# Patient Record
Sex: Male | Born: 1950 | Race: White | Hispanic: No | Smoking: Current every day smoker
Health system: Southern US, Community
[De-identification: ages and names within clinical notes are randomized; demographics above are authoritative.]

---

## 2012-03-19 ENCOUNTER — Emergency Department: Payer: Self-pay | Admitting: Emergency Medicine

## 2012-03-19 LAB — URINALYSIS, COMPLETE
Bacteria: NONE SEEN
Bilirubin,UR: NEGATIVE
Blood: NEGATIVE
Glucose,UR: NEGATIVE mg/dL (ref 0–75)
Ketone: NEGATIVE
Nitrite: NEGATIVE
Specific Gravity: 1.023 (ref 1.003–1.030)
Squamous Epithelial: <1
WBC UR: 1 /HPF (ref 0–5)

## 2012-03-19 LAB — DRUG SCREEN, URINE
Amphetamines, Ur Screen: NEGATIVE (ref ?–1000)
Cocaine Metabolite,Ur ~~LOC~~: POSITIVE (ref ?–300)
MDMA (Ecstasy)Ur Screen: NEGATIVE (ref ?–500)
Tricyclic, Ur Screen: NEGATIVE (ref ?–1000)

## 2012-03-19 LAB — CBC
HCT: 41.4 % (ref 40.0–52.0)
HGB: 13.9 g/dL (ref 13.0–18.0)
MCH: 32.9 pg (ref 26.0–34.0)
MCHC: 33.5 g/dL (ref 32.0–36.0)
Platelet: 225 10*3/uL (ref 150–440)

## 2012-03-19 LAB — COMPREHENSIVE METABOLIC PANEL
Alkaline Phosphatase: 97 U/L (ref 50–136)
BUN: 12 mg/dL (ref 7–18)
Bilirubin,Total: 0.4 mg/dL (ref 0.2–1.0)
Creatinine: 0.97 mg/dL (ref 0.60–1.30)
EGFR (Non-African Amer.): >60
Osmolality: 281 (ref 275–301)
SGPT (ALT): 82 U/L — ABNORMAL HIGH (ref 12–78)
Sodium: 141 mmol/L (ref 136–145)
Total Protein: 8 g/dL (ref 6.4–8.2)

## 2012-03-19 LAB — ETHANOL: Ethanol %: <0.003 % (ref 0.000–0.080)

## 2012-03-19 LAB — TSH: Thyroid Stimulating Horm: 1.13 u[IU]/mL

## 2012-03-22 ENCOUNTER — Emergency Department: Payer: Self-pay | Admitting: Emergency Medicine

## 2012-03-22 LAB — CBC
HCT: 47.7 % (ref 40.0–52.0)
HGB: 16.2 g/dL (ref 13.0–18.0)
MCH: 32.5 pg (ref 26.0–34.0)
MCV: 96 fL (ref 80–100)
RBC: 4.99 10*6/uL (ref 4.40–5.90)
RDW: 14 % (ref 11.5–14.5)

## 2012-03-22 LAB — URINALYSIS, COMPLETE
Bilirubin,UR: NEGATIVE
Blood: NEGATIVE
Ketone: NEGATIVE
Ph: 7 (ref 4.5–8.0)
Specific Gravity: 1.011 (ref 1.003–1.030)
Squamous Epithelial: NONE SEEN

## 2012-03-22 LAB — COMPREHENSIVE METABOLIC PANEL
Albumin: 3.4 g/dL (ref 3.4–5.0)
Alkaline Phosphatase: 105 U/L (ref 50–136)
BUN: 10 mg/dL (ref 7–18)
Bilirubin,Total: 1.1 mg/dL — ABNORMAL HIGH (ref 0.2–1.0)
Calcium, Total: 9.7 mg/dL (ref 8.5–10.1)
Co2: 28 mmol/L (ref 21–32)
Creatinine: 0.82 mg/dL (ref 0.60–1.30)
Glucose: 93 mg/dL (ref 65–99)
Osmolality: 267 (ref 275–301)
Sodium: 134 mmol/L — ABNORMAL LOW (ref 136–145)
Total Protein: 9.1 g/dL — ABNORMAL HIGH (ref 6.4–8.2)

## 2012-03-22 LAB — MAGNESIUM: Magnesium: 2.3 mg/dL

## 2012-03-22 LAB — TROPONIN I: Troponin-I: <0.02 ng/mL

## 2012-05-21 ENCOUNTER — Emergency Department: Payer: Self-pay | Admitting: Internal Medicine

## 2012-05-21 LAB — CBC
HCT: 45.4 % (ref 40.0–52.0)
MCH: 32.2 pg (ref 26.0–34.0)
MCHC: 33.7 g/dL (ref 32.0–36.0)
Platelet: 410 10*3/uL (ref 150–440)
RDW: 14.6 % — ABNORMAL HIGH (ref 11.5–14.5)
WBC: 12.6 10*3/uL — ABNORMAL HIGH (ref 3.8–10.6)

## 2012-05-21 LAB — DRUG SCREEN, URINE
Amphetamines, Ur Screen: NEGATIVE (ref ?–1000)
Barbiturates, Ur Screen: NEGATIVE (ref ?–200)
Benzodiazepine, Ur Scrn: NEGATIVE (ref ?–200)
Cocaine Metabolite,Ur ~~LOC~~: POSITIVE (ref ?–300)
MDMA (Ecstasy)Ur Screen: NEGATIVE (ref ?–500)
Methadone, Ur Screen: POSITIVE (ref ?–300)
Tricyclic, Ur Screen: NEGATIVE (ref ?–1000)

## 2012-05-21 LAB — ETHANOL: Ethanol: <3 mg/dL

## 2012-05-21 LAB — URINALYSIS, COMPLETE
Bilirubin,UR: NEGATIVE
Glucose,UR: NEGATIVE mg/dL (ref 0–75)
Ketone: NEGATIVE
Ph: 6 (ref 4.5–8.0)
Protein: 30
RBC,UR: 8 /HPF (ref 0–5)
Specific Gravity: 1.027 (ref 1.003–1.030)
Squamous Epithelial: <1
WBC UR: 3 /HPF (ref 0–5)

## 2012-05-21 LAB — COMPREHENSIVE METABOLIC PANEL
Albumin: 3.2 g/dL — ABNORMAL LOW (ref 3.4–5.0)
Alkaline Phosphatase: 124 U/L (ref 50–136)
Calcium, Total: 9.3 mg/dL (ref 8.5–10.1)
EGFR (African American): >60
EGFR (Non-African Amer.): >60
Glucose: 101 mg/dL — ABNORMAL HIGH (ref 65–99)
Osmolality: 260 (ref 275–301)
SGOT(AST): 19 U/L (ref 15–37)

## 2012-05-21 LAB — TSH: Thyroid Stimulating Horm: <0.01 u[IU]/mL — ABNORMAL LOW

## 2012-08-20 ENCOUNTER — Emergency Department: Payer: Self-pay | Admitting: Emergency Medicine

## 2012-08-20 LAB — URINALYSIS, COMPLETE
Bacteria: NONE SEEN
Glucose,UR: NEGATIVE mg/dL (ref 0–75)
Ketone: NEGATIVE
Nitrite: NEGATIVE
Ph: 6 (ref 4.5–8.0)
Protein: NEGATIVE
Squamous Epithelial: NONE SEEN
WBC UR: <1 /HPF (ref 0–5)

## 2012-08-20 LAB — DRUG SCREEN, URINE
Barbiturates, Ur Screen: NEGATIVE (ref ?–200)
Cannabinoid 50 Ng, Ur ~~LOC~~: NEGATIVE (ref ?–50)
Cocaine Metabolite,Ur ~~LOC~~: POSITIVE (ref ?–300)
MDMA (Ecstasy)Ur Screen: NEGATIVE (ref ?–500)
Methadone, Ur Screen: NEGATIVE (ref ?–300)
Opiate, Ur Screen: POSITIVE (ref ?–300)
Tricyclic, Ur Screen: NEGATIVE (ref ?–1000)

## 2012-08-20 LAB — CBC
MCH: 32.8 pg (ref 26.0–34.0)
MCHC: 33.4 g/dL (ref 32.0–36.0)
MCV: 98 fL (ref 80–100)
RBC: 4.26 10*6/uL — ABNORMAL LOW (ref 4.40–5.90)
WBC: 9.5 10*3/uL (ref 3.8–10.6)

## 2012-08-20 LAB — COMPREHENSIVE METABOLIC PANEL
Alkaline Phosphatase: 105 U/L (ref 50–136)
Calcium, Total: 8.6 mg/dL (ref 8.5–10.1)
Co2: 25 mmol/L (ref 21–32)
EGFR (African American): >60
EGFR (Non-African Amer.): >60
Potassium: 3.7 mmol/L (ref 3.5–5.1)
SGOT(AST): 71 U/L — ABNORMAL HIGH (ref 15–37)
SGPT (ALT): 62 U/L (ref 12–78)
Sodium: 136 mmol/L (ref 136–145)

## 2012-08-20 LAB — ETHANOL: Ethanol %: <0.003 % (ref 0.000–0.080)

## 2013-10-04 IMAGING — CR DG CHEST 2V
1 series · 3 of 3 positions shown · non-contrast
Comparison: none

REASON FOR EXAM: bradycardia
COMMENTS:

PROCEDURE:     DXR - DXR CHEST PA (OR AP) AND LATERAL  - March 22, 2012  [DATE]
RESULT:     Comparison: None

[Series 1: pa · 0.17mm/px · 3 of 3 slices shown]
[im 1/3]
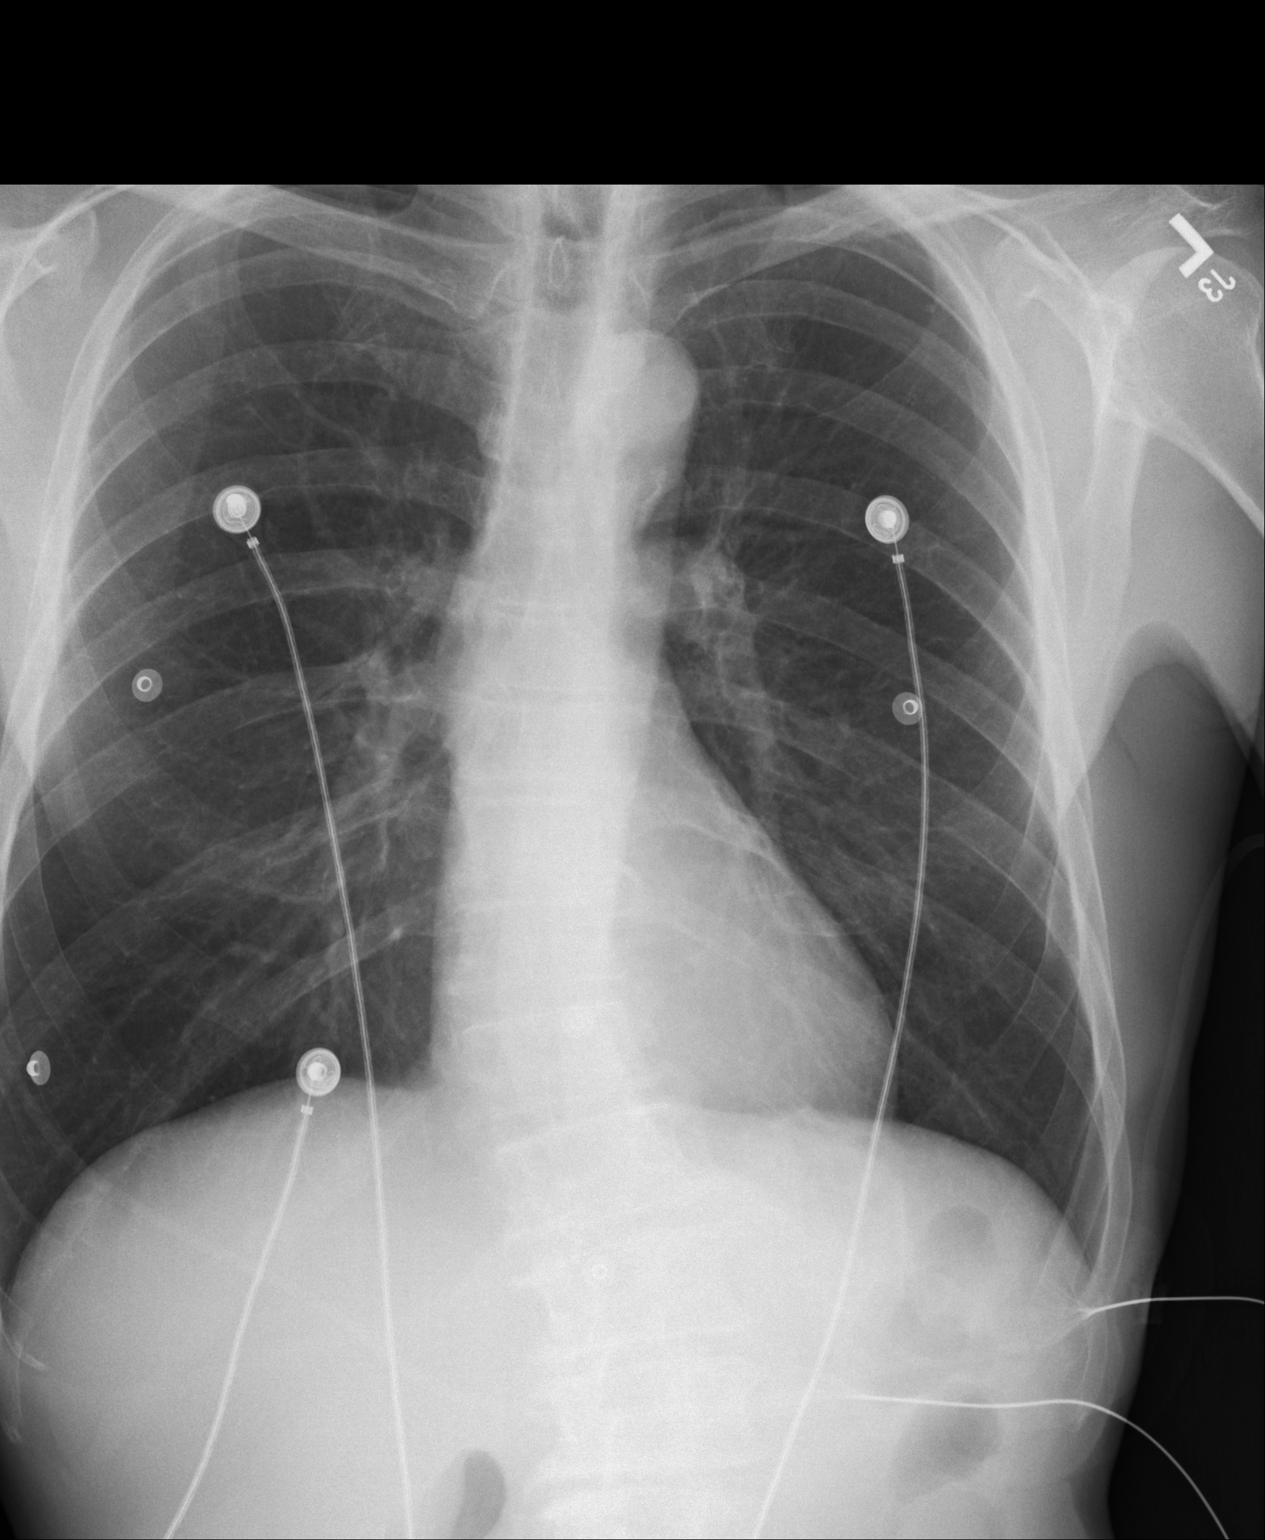
[im 2/3]
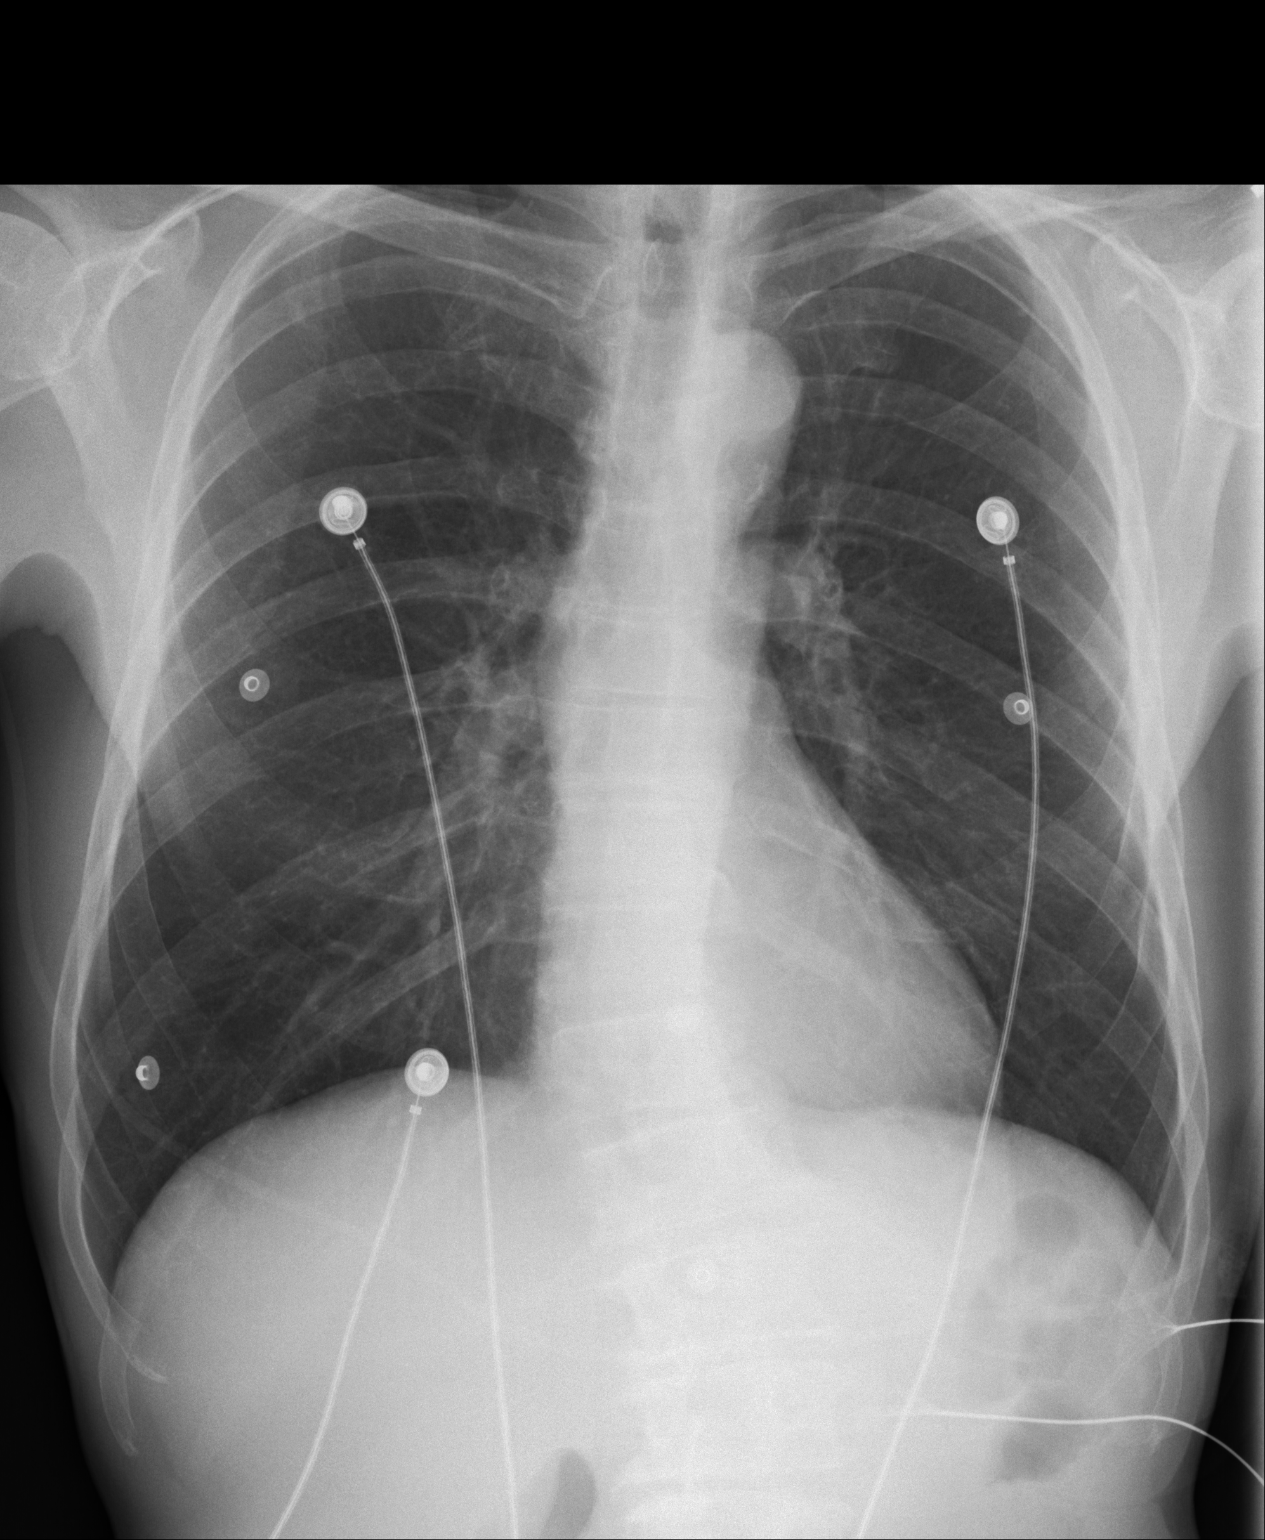
[im 3/3]
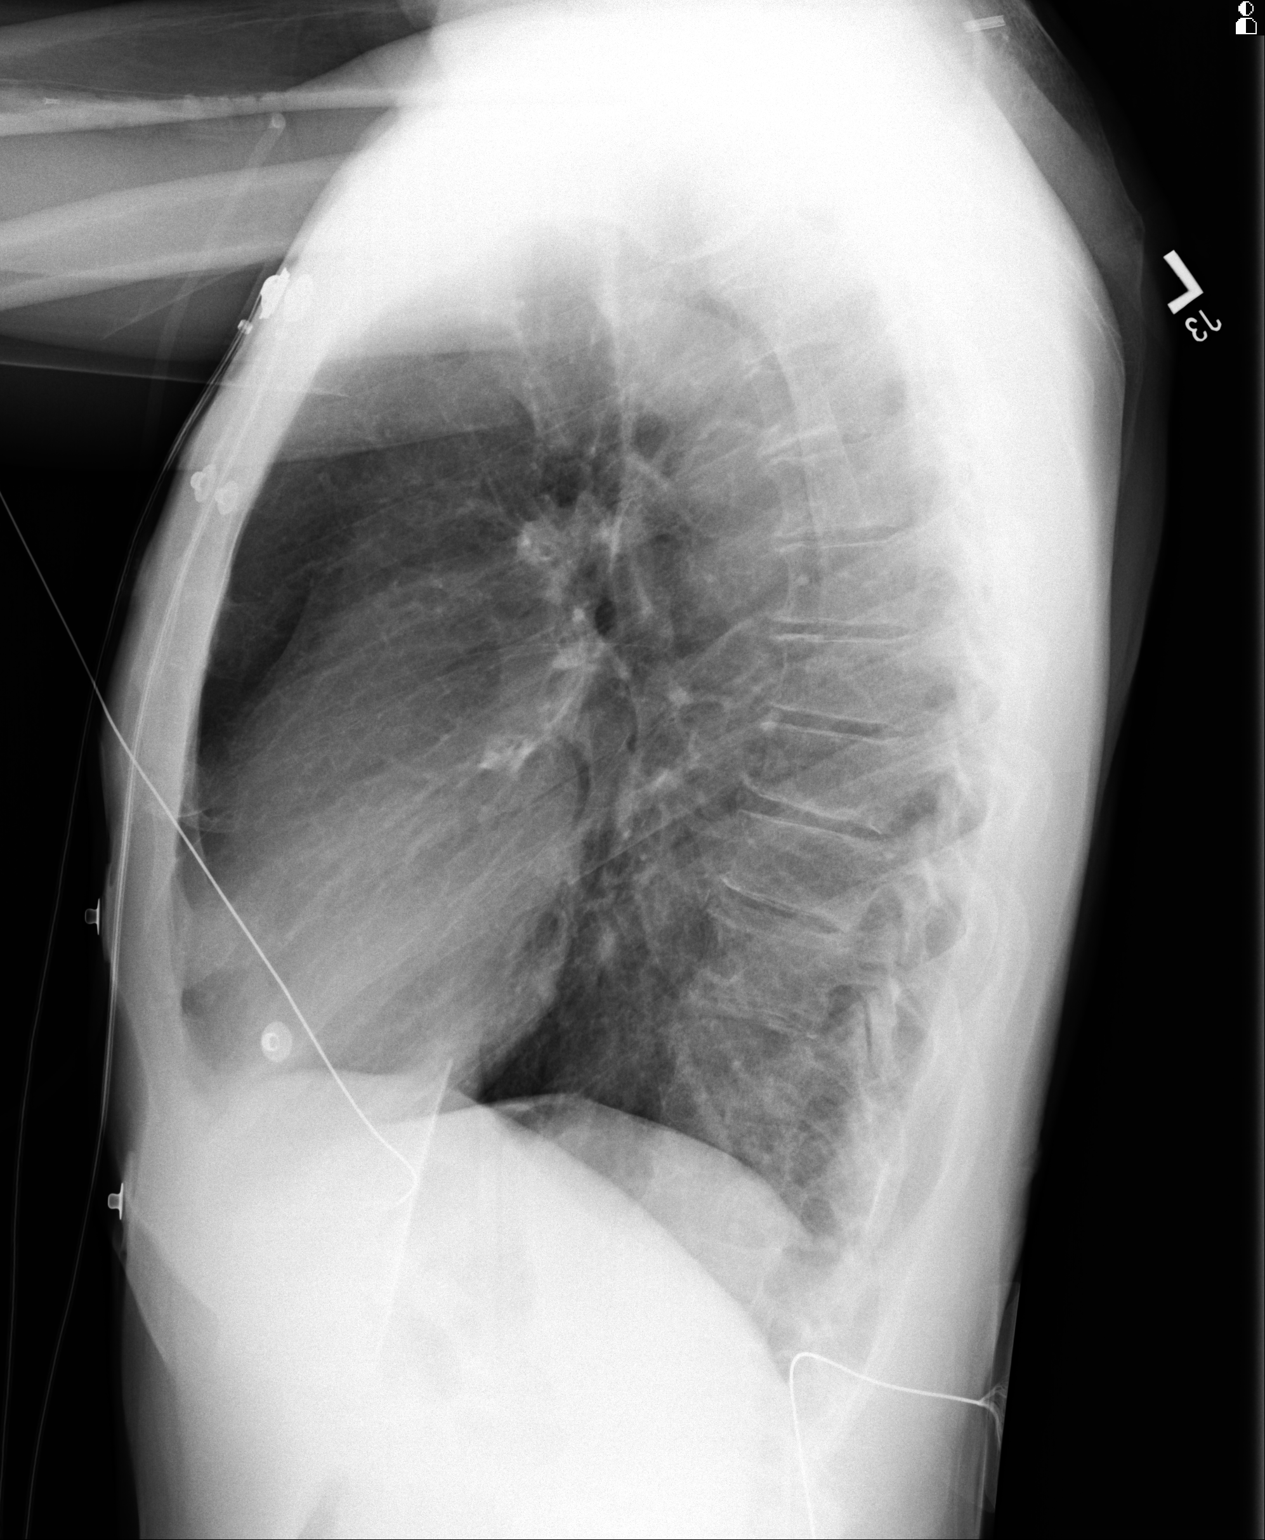

[3 of 3 positions shown; findings below may reference images not displayed]

FINDINGS: PA and lateral chest radiographs are provided.  There is no focal
parenchymal opacity, pleural effusion, or pneumothorax. The heart and
mediastinum are unremarkable.  The osseous structures are unremarkable.
IMPRESSION: No acute disease of the che[REDACTED]

## 2013-10-24 ENCOUNTER — Emergency Department: Payer: Self-pay | Admitting: Emergency Medicine

## 2013-10-24 LAB — URINALYSIS, COMPLETE
BACTERIA: NONE SEEN
BLOOD: NEGATIVE
Bilirubin,UR: NEGATIVE
Glucose,UR: NEGATIVE mg/dL (ref 0–75)
LEUKOCYTE ESTERASE: NEGATIVE
Nitrite: NEGATIVE
PROTEIN: NEGATIVE
Ph: 6 (ref 4.5–8.0)
SPECIFIC GRAVITY: 1.028 (ref 1.003–1.030)
Squamous Epithelial: NONE SEEN

## 2013-10-24 LAB — CBC
HCT: 43.1 % (ref 40.0–52.0)
HGB: 14.5 g/dL (ref 13.0–18.0)
MCH: 33.6 pg (ref 26.0–34.0)
MCHC: 33.7 g/dL (ref 32.0–36.0)
MCV: 100 fL (ref 80–100)
Platelet: 300 10*3/uL (ref 150–440)
RBC: 4.32 10*6/uL — AB (ref 4.40–5.90)
RDW: 14.5 % (ref 11.5–14.5)
WBC: 6.7 10*3/uL (ref 3.8–10.6)

## 2013-10-24 LAB — COMPREHENSIVE METABOLIC PANEL
ALBUMIN: 3.1 g/dL — AB (ref 3.4–5.0)
AST: 205 U/L — AB (ref 15–37)
Alkaline Phosphatase: 103 U/L
Anion Gap: 5 — ABNORMAL LOW (ref 7–16)
BUN: 12 mg/dL (ref 7–18)
Bilirubin,Total: 0.3 mg/dL (ref 0.2–1.0)
CALCIUM: 8.5 mg/dL (ref 8.5–10.1)
CO2: 30 mmol/L (ref 21–32)
CREATININE: 1.04 mg/dL (ref 0.60–1.30)
Chloride: 102 mmol/L (ref 98–107)
GLUCOSE: 147 mg/dL — AB (ref 65–99)
OSMOLALITY: 276 (ref 275–301)
Potassium: 4 mmol/L (ref 3.5–5.1)
SGPT (ALT): 325 U/L — ABNORMAL HIGH (ref 12–78)
SODIUM: 137 mmol/L (ref 136–145)
TOTAL PROTEIN: 7.7 g/dL (ref 6.4–8.2)

## 2013-10-24 LAB — DRUG SCREEN, URINE
AMPHETAMINES, UR SCREEN: NEGATIVE (ref ?–1000)
Barbiturates, Ur Screen: NEGATIVE (ref ?–200)
Benzodiazepine, Ur Scrn: NEGATIVE (ref ?–200)
Cannabinoid 50 Ng, Ur ~~LOC~~: NEGATIVE (ref ?–50)
Cocaine Metabolite,Ur ~~LOC~~: NEGATIVE (ref ?–300)
MDMA (Ecstasy)Ur Screen: NEGATIVE (ref ?–500)
METHADONE, UR SCREEN: NEGATIVE (ref ?–300)
Opiate, Ur Screen: POSITIVE (ref ?–300)
Phencyclidine (PCP) Ur S: NEGATIVE (ref ?–25)
Tricyclic, Ur Screen: NEGATIVE (ref ?–1000)

## 2013-10-24 LAB — ETHANOL
Ethanol %: <0.003 % (ref 0.000–0.080)
Ethanol: <3 mg/dL

## 2013-10-24 LAB — SALICYLATE LEVEL: Salicylates, Serum: <1.7 mg/dL

## 2013-10-24 LAB — ACETAMINOPHEN LEVEL

## 2014-02-20 ENCOUNTER — Emergency Department: Payer: Self-pay | Admitting: Emergency Medicine

## 2014-02-20 LAB — URINALYSIS, COMPLETE
BILIRUBIN, UR: NEGATIVE
BLOOD: NEGATIVE
Bacteria: NONE SEEN
GLUCOSE, UR: NEGATIVE mg/dL (ref 0–75)
Ketone: NEGATIVE
Leukocyte Esterase: NEGATIVE
NITRITE: NEGATIVE
PH: 5 (ref 4.5–8.0)
Protein: NEGATIVE
RBC,UR: NONE SEEN /HPF (ref 0–5)
Specific Gravity: 1.009 (ref 1.003–1.030)
Squamous Epithelial: NONE SEEN
WBC UR: <1 /HPF (ref 0–5)

## 2014-02-20 LAB — ETHANOL
ETHANOL LVL: 5 mg/dL
Ethanol %: 0.005 % (ref 0.000–0.080)

## 2014-02-20 LAB — CBC
HCT: 41.4 % (ref 40.0–52.0)
HGB: 13.6 g/dL (ref 13.0–18.0)
MCH: 32.8 pg (ref 26.0–34.0)
MCHC: 32.7 g/dL (ref 32.0–36.0)
MCV: 100 fL (ref 80–100)
PLATELETS: 293 10*3/uL (ref 150–440)
RBC: 4.13 10*6/uL — AB (ref 4.40–5.90)
RDW: 14.4 % (ref 11.5–14.5)
WBC: 6.7 10*3/uL (ref 3.8–10.6)

## 2014-02-20 LAB — COMPREHENSIVE METABOLIC PANEL
ANION GAP: 7 (ref 7–16)
Albumin: 3.2 g/dL — ABNORMAL LOW (ref 3.4–5.0)
Alkaline Phosphatase: 91 U/L
BUN: 9 mg/dL (ref 7–18)
Bilirubin,Total: 0.5 mg/dL (ref 0.2–1.0)
Calcium, Total: 8.7 mg/dL (ref 8.5–10.1)
Chloride: 107 mmol/L (ref 98–107)
Co2: 23 mmol/L (ref 21–32)
Creatinine: 0.79 mg/dL (ref 0.60–1.30)
EGFR (African American): >60
EGFR (Non-African Amer.): >60
GLUCOSE: 156 mg/dL — AB (ref 65–99)
OSMOLALITY: 276 (ref 275–301)
POTASSIUM: 3.6 mmol/L (ref 3.5–5.1)
SGOT(AST): 29 U/L (ref 15–37)
SGPT (ALT): 21 U/L
Sodium: 137 mmol/L (ref 136–145)
Total Protein: 8 g/dL (ref 6.4–8.2)

## 2014-02-20 LAB — DRUG SCREEN, URINE
AMPHETAMINES, UR SCREEN: NEGATIVE (ref ?–1000)
Barbiturates, Ur Screen: NEGATIVE (ref ?–200)
Benzodiazepine, Ur Scrn: NEGATIVE (ref ?–200)
Cannabinoid 50 Ng, Ur ~~LOC~~: NEGATIVE (ref ?–50)
Cocaine Metabolite,Ur ~~LOC~~: POSITIVE (ref ?–300)
MDMA (ECSTASY) UR SCREEN: NEGATIVE (ref ?–500)
Methadone, Ur Screen: NEGATIVE (ref ?–300)
Opiate, Ur Screen: POSITIVE (ref ?–300)
Phencyclidine (PCP) Ur S: NEGATIVE (ref ?–25)
Tricyclic, Ur Screen: NEGATIVE (ref ?–1000)

## 2014-02-20 LAB — SALICYLATE LEVEL: SALICYLATES, SERUM: 2.4 mg/dL

## 2014-02-20 LAB — ACETAMINOPHEN LEVEL

## 2014-10-21 NOTE — Consult Note (Signed)
Brief Consult Note: Diagnosis: opiate dependence.   Patient was seen by consultant.   Consult note dictated.   Discussed with Attending MD.   Comments: Psychiatry: PAtient seen and chart reviewed. See full note. Patient is be discharged from ER and can be referred to [facility].  Electronic Signatures: Audery Amellapacs, Jervon Ream T (MD)  (Signed 25-Aug-15 14:38)  Authored: Brief Consult Note   Last Updated: 25-Aug-15 14:38 by Audery Amellapacs, Laqueisha Catalina T (MD)

## 2014-10-21 NOTE — Consult Note (Signed)
PATIENT NAME:  Juan Schneider, Juan Schneider MR#:  [id number] DATE OF BIRTH:  1951-05-05  DATE OF CONSULTATION:  10/24/2013  REFERRING PHYSICIAN:  Cecille Amsterdam. Mayford Knife, MD CONSULTING PHYSICIAN:  Ardeen Fillers. Garnetta Buddy, MD  REASON FOR CONSULTATION: "I need to get off this heroin. I'm broke."   HISTORY OF PRESENT ILLNESS: The patient is a 64 year old male who presented to the ER requesting detox from heroin. He reported that he needs help now. The patient stated that he has been using heroin, 6 bags per day, for many years. He stated that he has spent all his money on the heroin and does not want to continuously use heroin. He stated that he will not get another check until mid May and needs help. He reported that he has been using 5 to 6 bags of heroin for the past 4 years. Reported that he was clean for 1 year after going to [facility] and [facility] in 2013. Called [facility] and Rudell Cobb spoke with [employer] who stated that the patient had a very difficult detox last time, and they are not able to accept him for another detox. The patient reported that he is currently having withdrawal symptoms including nausea, vomiting, diarrhea, abdominal cramps. His last use was yesterday. He reported that a guy in the neighborhood dropped him to the ER. The patient stated that he has been using IV heroin. He stated that he does not have any money and does not use any other drugs, including alcohol, cocaine, marijuana at this time. He feels depressed and having passive suicidal ideations. He was unable to contract for safety at this time.   PAST PSYCHIATRIC HISTORY: The patient reported that he has a long history of using drugs, including heroin. He is currently having withdrawal symptoms and was feeling very anxious and agitated. He was unable to contract for safety at this time.   SUBSTANCE ABUSE HISTORY: The patient reported that he was in a drug rehab program, including [facility] and [facility], in the past. He was able to maintain sobriety for approximately 1 year but  then relapsed again.   FAMILY HISTORY: The patient reported that he does not have any family history of drug use.   ALLERGIES: No known drug allergies.   SOCIAL HISTORY: The patient currently lives by himself. He stated that he is unable to support himself. He denied any pending legal charges.   MEDICAL HISTORY: The patient denied having any current medical problems.   VITAL SIGNS: Temperature 97, pulse 64, respirations 18, blood pressure 124/65.  LABORATORY DATA: Glucose 147, BUN 12, creatinine 1.04, sodium 137, potassium 4.0, chloride 102, bicarbonate 30, anion gap 5, osmolality 276, calcium 8.5. Blood alcohol level less than 3. Protein 7.7, albumin 3.1, bilirubin 0.3, alkaline phosphatase 103, AST 205, ALT 325. UDS is positive for opioids. WBC 6.7, RBC 4.32, hemoglobin 14.5, platelet count 300; MCV 100; RDW is 14.5.  REVIEW OF SYSTEMS:    CONSTITUTIONAL: Denies any fever or chills. Lost 20 pounds in 6 months.  EYES: No double or blurred vision.  RESPIRATORY: No shortness of breath or cough.  CARDIOVASCULAR: No chest pain or orthopnea.  GASTROINTESTINAL: Complaining of abdominal pain, nausea, vomiting, and diarrhea.  GENITOURINARY: No incontinence or frequency.  ENDOCRINE: No heat or cold intolerance.  LYMPHATIC: No anemia or easy bruising.  INTEGUMENTARY: No acne or rash.  MUSCULOSKELETAL: Complaining of muscle and joint pain.  NEUROLOGIC: No tingling or weakness.   MENTAL STATUS EXAMINATION: The patient is a thinly built male who was lying  still in the bed. He maintained poor eye contact. His speech was low in tone and volume. Mood was depressed and anxious. Affect was congruent. Thought process was circumstantial. Thought content was nondelusional. He appeared very depressed and anxious, unable to contract for safety due to long use of heroin. Language was fair. Fund of knowledge seemed appropriate.   DIAGNOSTIC IMPRESSION: AXIS I: 1.  Major depressive disorder, recurrent,  moderate.  2.  Heroin dependence.  AXIS II: None.  AXIS III: None reported.   TREATMENT PLAN: 1.  I will initiate involuntary commitment on the patient, as he is unable to contract for safety at this time.  2.  I will start him on the opioid withdrawal detox protocol, including clonidine, loperamide, Flexeril, and Ativan.  3.  Theodosia PalingKent Smith has called [facility], and they will have a bed available for him tomorrow. Discussed with the patient about the same, and he agreed with the plan. Will continue to monitor him in the ED at this time.   Thank you for allowing me to participate in the care of this patient.   ____________________________ Ardeen FillersUzma S. Garnetta BuddyFaheem, MD usf:jcm D: 10/25/2013 12:28:34 ET T: 10/25/2013 12:54:59 ET JOB#: [id number]  cc: Ardeen FillersUzma S. Garnetta BuddyFaheem, MD, <Dictator> Rhunette CroftUZMA S Dayden Viverette MD ELECTRONICALLY SIGNED 11/01/2013 13:01

## 2014-10-21 NOTE — Consult Note (Signed)
PATIENT NAME:  Juan Schneider, [name] MR#:  [id number] DATE OF BIRTH:  07/24/1950  DATE OF CONSULTATION:  02/21/2014  REFERRING PHYSICIAN:   CONSULTING PHYSICIAN:  Audery AmelJohn T. Amarea Macdowell, MD  IDENTIFYING INFORMATION AND REASON FOR CONSULTATION: A 64 year old man with a history of opiate dependence presents to the hospital.   CHIEF COMPLAINT: "Heroin addiction. I need to get off it."   HISTORY OF PRESENT ILLNESS: Information obtained from the patient and the chart. The patient presented voluntarily to the hospital stating that he was addicted to heroin and wanted to get treatment for it. He says he is using 5 to 6 bags of heroin intravenously a day. Last use was a little over a day ago. Denies that he is using any other drugs regularly. Drinks minimally. Mood has been a little bit down, but not hopeless. Energy level chronically low. He denies any suicidal ideation. Denies any wish to die or thoughts of harming himself. Denies homicidal ideation. Denies any psychotic symptoms. The patient is concerned about the effect his substance abuse is having on his ability to cope with life and be with his family.   PAST PSYCHIATRIC HISTORY: The patient has had referral to the alcohol and drug abuse treatment center in the past. He says he has gone there for a couple of weeks and has managed to stay sober for a couple of months afterwards. He has never been treated for any other psychiatric problems. No history of diagnosis of depression. No history of suicide attempts. No history of violent behavior.   PAST MEDICAL HISTORY: The patient is reporting no ongoing medical problems. He is on no prescription medicines for anything at all. No history of heart disease or stroke.   SOCIAL HISTORY: He is retired from working in the [employer] industry. Lives at home with some of his adult children. Not working.   FAMILY HISTORY: No known family history of mental illness.   CURRENT MEDICATIONS: None.   ALLERGIES: No known drug allergies.    REVIEW OF SYSTEMS: Feeling a little bit sick to his stomach. Having diarrhea. Feeling run down. Denies suicidal or homicidal ideation. Denies hopelessness. No cardiac complaints. No pulmonary complaints. The rest of the review of systems is negative.   MENTAL STATUS EXAMINATION: This is a disheveled gentleman who appears in a mild degree of physical distress. Cooperative with the exam. Eye contact good. Psychomotor activity normal. No sign of tremor. Speech normal rate, tone, and volume. Affect is dysphoric and anxious, not tearful. Mood stated as being down. Thoughts are lucid. No evidence of loosening of associations or delusions. Denies auditory or visual hallucinations. Denies suicidal or homicidal ideation. Shows pretty good insight and judgment. Normal intelligence. Recall 3/3 objects immediately and 2/3 at 3 minutes.   VITAL SIGNS: Currently, blood pressure 123/73, respirations 18, pulse 76, temperature 97.2.   LABORATORY RESULTS: Salicylates and acetaminophen normal. Alcohol level is 5. Chemistry panel shows an elevated glucose of 156. CBC largely unremarkable. Urinalysis unremarkable. Drug screen positive for cocaine and opiates.   ASSESSMENT: This is a 64 year old man with heroin dependence, possible cocaine abuse or dependence as well. Mild mood symptoms. No evidence of suicidality or psychosis. No other ongoing medical problems, physically stable currently. Does not require inpatient treatment.   TREATMENT PLAN: The patient was counseled about the importance of trying to get sober and the negative effects of opiate abuse, especially IV drugs. Strongly encouraged to continue in his plan to stay sober. He will be discharged from the Emergency Room  with no medication. We have talked to [facility] and they said that he can come down there and be evaluated today for possible admission. He also can contact outpatient providers for possible referral back to [facility]. Encouraged to [facility].   DIAGNOSIS, PRINCIPAL AND PRIMARY:  AXIS I: Opiate dependence.   SECONDARY DIAGNOSES: AXIS I: Substance-induced mood disorder, depressed, mild.  AXIS II: No diagnosis.  AXIS III: No diagnosis.  AXIS III: No diagnosis.     ____________________________ Audery Amel, MD jtc:at D: 02/21/2014 14:36:22 ET T: 02/21/2014 14:50:25 ET JOB#: [id number]  cc: Audery Amel, MD, <Dictator> Audery Amel MD ELECTRONICALLY SIGNED 03/10/2014 16:58

## 2016-01-10 ENCOUNTER — Emergency Department
Admission: EM | Admit: 2016-01-10 | Discharge: 2016-01-10 | Disposition: A | Discharge status: Home / Self Care | Payer: Self-pay | Attending: Emergency Medicine | Admitting: Emergency Medicine

## 2016-01-10 ENCOUNTER — Encounter: Payer: Self-pay | Admitting: Emergency Medicine

## 2016-01-10 ENCOUNTER — Emergency Department: Payer: Self-pay

## 2016-01-10 DIAGNOSIS — F17210 Nicotine dependence, cigarettes, uncomplicated: Secondary | ICD-10-CM | POA: Insufficient documentation

## 2016-01-10 DIAGNOSIS — T40605A Adverse effect of unspecified narcotics, initial encounter: Secondary | ICD-10-CM | POA: Insufficient documentation

## 2016-01-10 DIAGNOSIS — Y829 Unspecified medical devices associated with adverse incidents: Secondary | ICD-10-CM | POA: Insufficient documentation

## 2016-01-10 DIAGNOSIS — T887XXA Unspecified adverse effect of drug or medicament, initial encounter: Secondary | ICD-10-CM | POA: Insufficient documentation

## 2016-01-10 DIAGNOSIS — T50905A Adverse effect of unspecified drugs, medicaments and biological substances, initial encounter: Secondary | ICD-10-CM

## 2016-01-10 LAB — URINALYSIS COMPLETE WITH MICROSCOPIC (ARMC ONLY)
BILIRUBIN URINE: NEGATIVE
Bacteria, UA: NONE SEEN
GLUCOSE, UA: NEGATIVE mg/dL
Hgb urine dipstick: NEGATIVE
KETONES UR: NEGATIVE mg/dL
LEUKOCYTES UA: NEGATIVE
NITRITE: NEGATIVE
Protein, ur: NEGATIVE mg/dL
SPECIFIC GRAVITY, URINE: 1.021 (ref 1.005–1.030)
Squamous Epithelial / LPF: NONE SEEN
pH: 5 (ref 5.0–8.0)

## 2016-01-10 LAB — BASIC METABOLIC PANEL
ANION GAP: 10 (ref 5–15)
BUN: 18 mg/dL (ref 6–20)
CHLORIDE: 108 mmol/L (ref 101–111)
CO2: 23 mmol/L (ref 22–32)
Calcium: 9.5 mg/dL (ref 8.9–10.3)
Creatinine, Ser: 0.97 mg/dL (ref 0.61–1.24)
GFR calc Af Amer: >60 mL/min (ref >60)
GLUCOSE: 136 mg/dL — AB (ref 65–99)
POTASSIUM: 4.1 mmol/L (ref 3.5–5.1)
Sodium: 141 mmol/L (ref 135–145)

## 2016-01-10 LAB — URINE DRUG SCREEN, QUALITATIVE (ARMC ONLY)
AMPHETAMINES, UR SCREEN: NOT DETECTED
BENZODIAZEPINE, UR SCRN: NOT DETECTED
Barbiturates, Ur Screen: NOT DETECTED
Cannabinoid 50 Ng, Ur ~~LOC~~: NOT DETECTED
Cocaine Metabolite,Ur ~~LOC~~: NOT DETECTED
MDMA (Ecstasy)Ur Screen: NOT DETECTED
METHADONE SCREEN, URINE: NOT DETECTED
OPIATE, UR SCREEN: POSITIVE — AB
Phencyclidine (PCP) Ur S: NOT DETECTED
Tricyclic, Ur Screen: NOT DETECTED

## 2016-01-10 LAB — CBC
HEMATOCRIT: 43.8 % (ref 40.0–52.0)
HEMOGLOBIN: 14.4 g/dL (ref 13.0–18.0)
MCH: 31.9 pg (ref 26.0–34.0)
MCHC: 32.8 g/dL (ref 32.0–36.0)
MCV: 97 fL (ref 80.0–100.0)
Platelets: 258 10*3/uL (ref 150–440)
RBC: 4.51 MIL/uL (ref 4.40–5.90)
RDW: 17 % — ABNORMAL HIGH (ref 11.5–14.5)
WBC: 11.5 10*3/uL — ABNORMAL HIGH (ref 3.8–10.6)

## 2016-01-10 MED ORDER — SODIUM CHLORIDE 0.9 % IV BOLUS (SEPSIS)
1000.0000 mL | Freq: Once | INTRAVENOUS | Status: AC
  Administered 2016-01-10: 1000 mL via INTRAVENOUS

## 2016-01-10 NOTE — ED Notes (Signed)
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 A&ox4 - states he feels exhausted. Denies loc, denies pain.

## 2016-01-10 NOTE — ED Notes (Signed)
Pt unable to [facility] after multiple attempts, Pt verbally consented to in and out catheter

## 2016-01-10 NOTE — ED Notes (Signed)
Pt told this RN that he took a couple of pain pills today that were prescribed to him

## 2016-01-10 NOTE — ED Provider Notes (Signed)
Time Seen: Approximately 1451  I have reviewed the triage notes  Chief Complaint: Heat Exposure   History of Present Illness: Juan Schneider is a 65 y.o. male who states he walked to the store today and was doing a lot of outside work and he seemed to be somewhat confused. Patient arrives here very drowsy. He states he smokes a pack of cigarettes per day but denies any alcohol or illicit drug usage. She slept fine last night except getting up and urinating frequently which is been going on for "" while "". Denies any fever at home or burning with urination. He states he felt fine this morning when he got up and states he thinks it might be the heat because he was working out in the garage etc. Patient appears to have sleep apnea according to the nursing staff and when he doses off his pulse ox would drop and he was placed on a 2 L nasal cannula for comfort. He denies any chest pain or shortness of breath. He denies any abdominal pain, nausea, vomiting. He denies any fever or productive cough.   History reviewed. No pertinent past medical history.  There are no active problems to display for this patient.   History reviewed. No pertinent past surgical history.  History reviewed. No pertinent past surgical history.  No current outpatient prescriptions on file.  Allergies:  Review of patient's allergies indicates no known allergies.  Family History: History reviewed. No pertinent family history.  Social History: Social History  Substance Use Topics  . Smoking status: Current Every Day Smoker -- 1.00 packs/day    Types: Cigarettes  . Smokeless tobacco: None  . Alcohol Use: No     Review of Systems:   10 point review of systems was performed and was otherwise negative:  Constitutional: No fever Eyes: No visual disturbances ENT: No sore throat, ear pain Cardiac: No chest pain Respiratory: No shortness of breath, wheezing, or stridor Abdomen: No abdominal pain, no vomiting, No  diarrhea Endocrine: No weight loss, No night sweats Extremities: No peripheral edema, cyanosis Skin: No rashes, easy bruising Neurologic: No focal weakness, trouble with speech or swollowing Urologic: No dysuria, Hematuria, or urinary frequency   Physical Exam:  ED Triage Vitals  Enc Vitals Group     BP 01/10/16 1340 124/85 mmHg     Pulse Rate 01/10/16 1340 88     Resp 01/10/16 1340 18     Temp 01/10/16 1340 97.6 F (36.4 C)     Temp Source 01/10/16 1340 Oral     SpO2 01/10/16 1340 96 %     Weight 01/10/16 1340 152 lb (68.947 kg)     Height 01/10/16 1340  (1.803 m)     Head Cir --      Peak Flow --      Pain Score --      Pain Loc --      Pain Edu? --      Excl. in GC? --     General: Awake , Alert , and Oriented times 3; Patient's somewhat somnolent pertinent wakes up easily Head: Normal cephalic , atraumatic Eyes: Pupils equal , round, reactive to light Nose/Throat: No nasal drainage, patent upper airway without erythema or exudate. Dry mucous membranes Neck: Supple, Full range of motion, No anterior adenopathy or palpable thyroid masses Lungs: Clear to ascultation without wheezes , rhonchi, or rales Heart: Regular rate, regular rhythm without murmurs , gallops , or rubs Abdomen: Soft, non  tender without rebound, guarding , or rigidity; bowel sounds positive and symmetric in all 4 quadrants. No organomegaly .        Extremities: 2 plus symmetric pulses. No edema, clubbing or cyanosis Neurologic: normal ambulation, Motor symmetric without deficits, sensory intact Skin: warm, dry, no rashes   Labs:   All laboratory work was reviewed including any pertinent negatives or positives listed below:  Labs Reviewed  BASIC METABOLIC PANEL - Abnormal; Notable for the following:    Glucose, Bld 136 (*)    All other components within normal limits  CBC - Abnormal; Notable for the following:    WBC 11.5 (*)    RDW 17.0 (*)    All other components within normal limits   CBG MONITORING, ED  Laboratory work was reviewed and showed no clinically significant abnormalities.   EKG:  ED ECG REPORT I, Jennye MoccasinBrian S Quigley, the attending physician, personally viewed and interpreted this ECG.  Date: 01/10/2016 EKG Time: 1352 Rate: 84 Rhythm: normal sinus rhythm QRS Axis: normal Intervals: normal ST/T Wave abnormalities: normal Conduction Disturbances: none Narrative Interpretation: unremarkable    Radiology:    CT Head Wo Contrast (Final result) Result time: 01/10/16 14:10:06   Final result by Rad Results In Interface (01/10/16 14:10:06)   Narrative:   CLINICAL DATA: Pt states he was outside working today and became very disoriented and confused. He also states he has been in and out of consciousness while riding to the hospital. NKI. No hx CA. No hx CVA, brain aneurysm or seizures.  EXAM: CT HEAD WITHOUT CONTRAST  TECHNIQUE: Contiguous axial images were obtained from the base of the skull through the vertex without intravenous contrast.  COMPARISON: None.  FINDINGS: The ventricles are normal in size and configuration.  There are no parenchymal masses or mass effect. There is no evidence of an infarct.  There are no extra-axial masses or abnormal fluid collections.  There is no intracranial hemorrhage.  The visualized sinuses and mastoid air cells are clear.  IMPRESSION: Normal unenhanced CT scan the brain   Electronically Signed By: Amie Portlandavid Ormond M.D. On: 01/10/2016 14:10          I personally reviewed the radiologic studies    ED Course: * Patient later in his state of his ED evaluation amended to take in a couple of "" pain pills "" earlier today. The patient's altered mental status is likely secondary to the adverse effects of what is likely to be narcotic pain medication. The patient was advised take the medication appropriately. He was advised drink plenty of fluids  Assessment:  Altered mental status  secondary to prescription pain medication*      Plan: Outpatient Patient was advised to return immediately if condition worsens. Patient was advised to follow up with their primary care physician or other specialized physicians involved in their outpatient care. The patient and/or family member/power of attorney had laboratory results reviewed at the bedside. All questions and concerns were addressed and appropriate discharge instructions were distributed by the nursing staff.            Jennye MoccasinBrian S Quigley, MD 01/10/16 2039

## 2016-07-31 DEATH — deceased

## 2017-07-24 IMAGING — CT CT HEAD W/O CM
3 series · 15 of 47 positions shown, 18 images · non-contrast
Comparison: None.

CLINICAL DATA: Pt states he was outside working today and became
very disoriented and confused. He also states he has been in and out
of consciousness while riding to the hospital. NKI. No hx CA. No hx
CVA, brain aneurysm or seizures.

EXAM:
CT HEAD WITHOUT CONTRAST
TECHNIQUE: Contiguous axial images were obtained from the base of the skull
through the vertex without intravenous contrast.

[Series 2: head wo · axial · 0.42mm/px · z∈[-110,+14]mm · 9 of 31 slices shown, 12 images]
[im 3/31  brain]
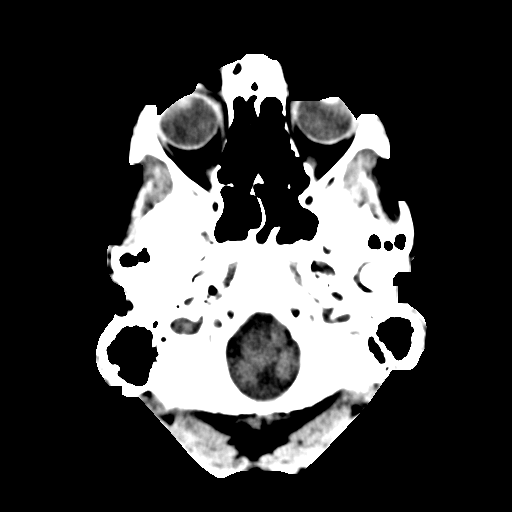
[im 3/31  bone]
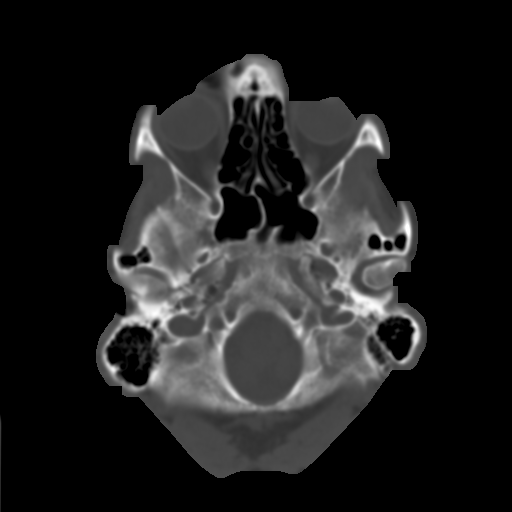
[im 6/31  brain]
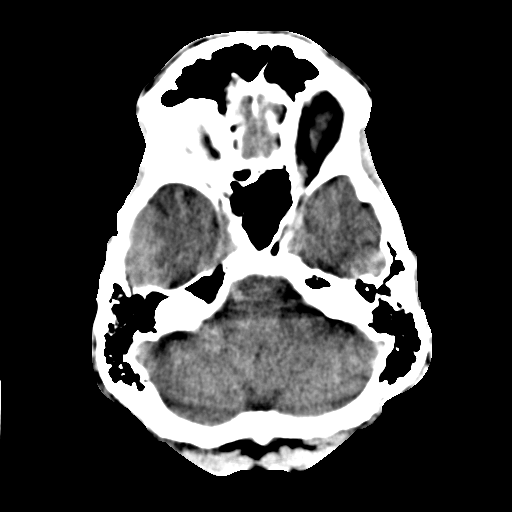
[im 9/31  brain]
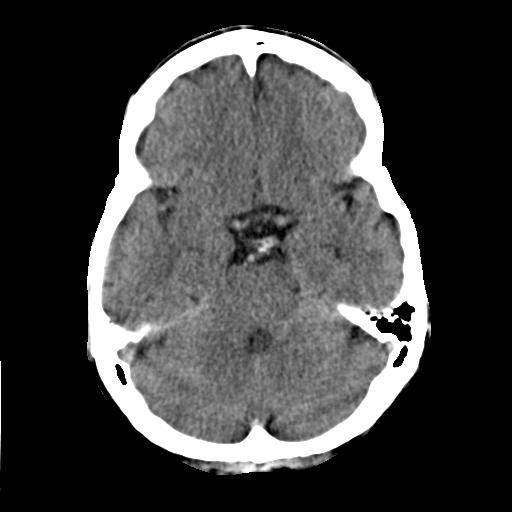
[im 12/31  brain]
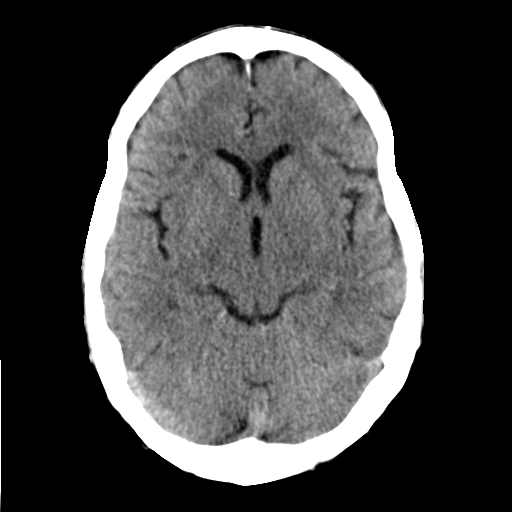
[im 16/31  brain]
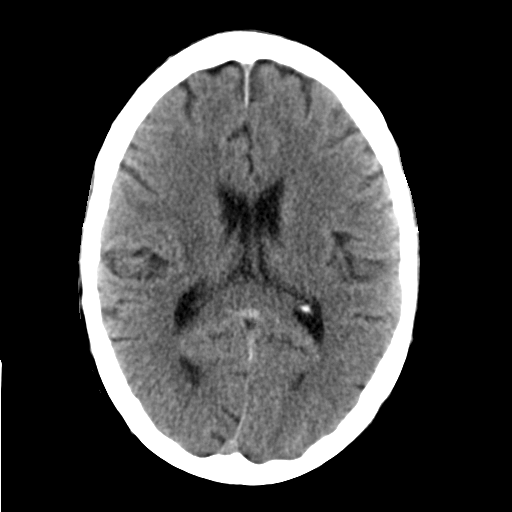
[im 16/31  bone]
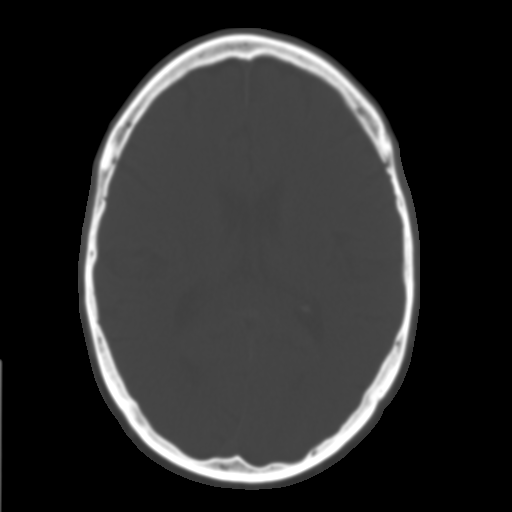
[im 19/31  brain]
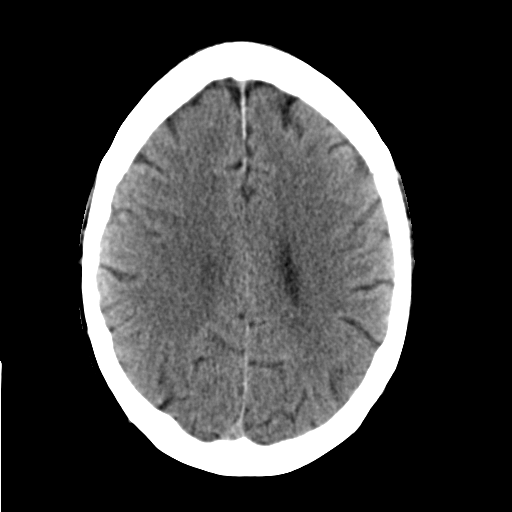
[im 22/31  brain]
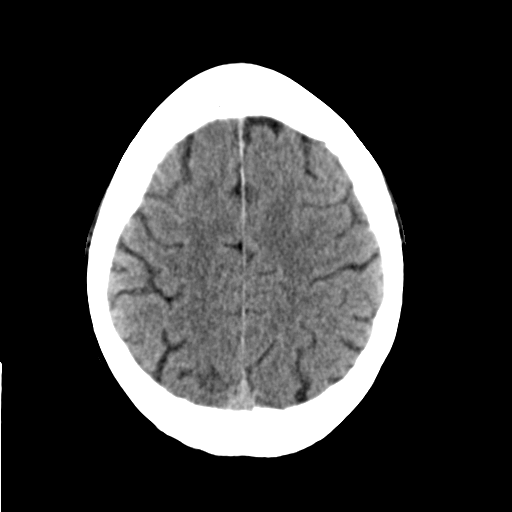
[im 25/31  brain]
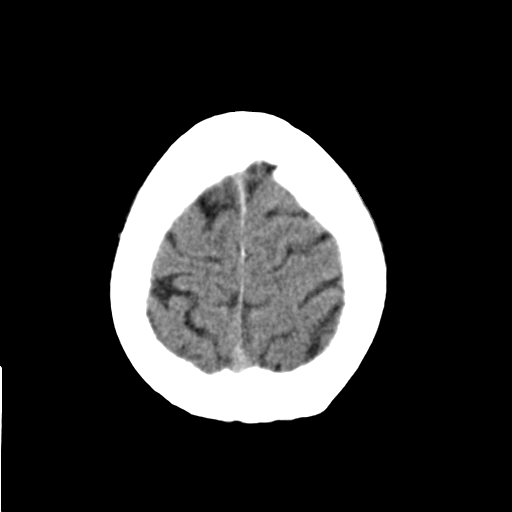
[im 28/31  brain]
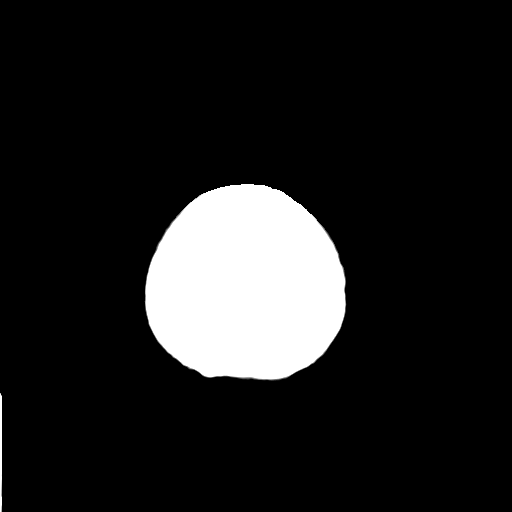
[im 28/31  bone]
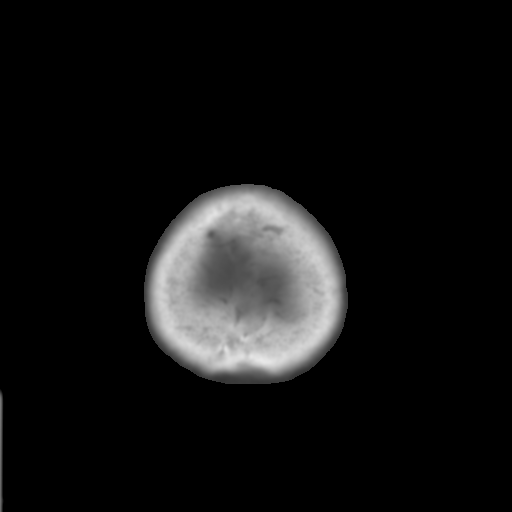

[Series 4: coronal soft tissue · coronal · 0.30mm/px · 3 of 73 slices shown]
[im 25/73  brain]
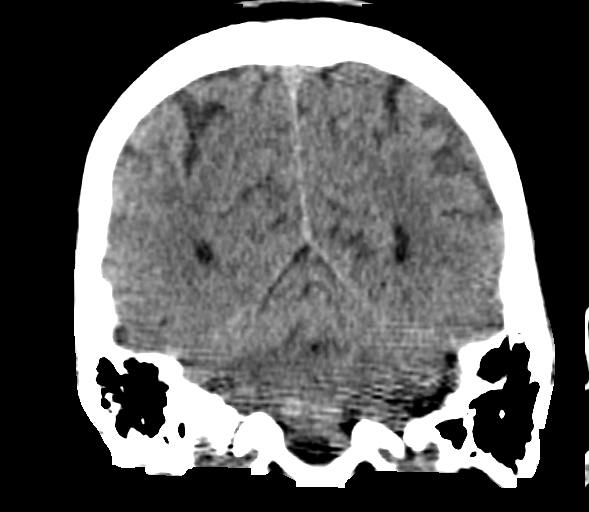
[im 33/73  brain]
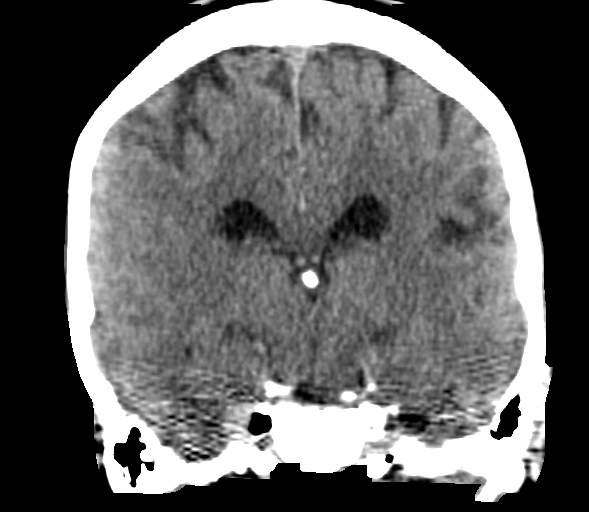
[im 41/73  brain]
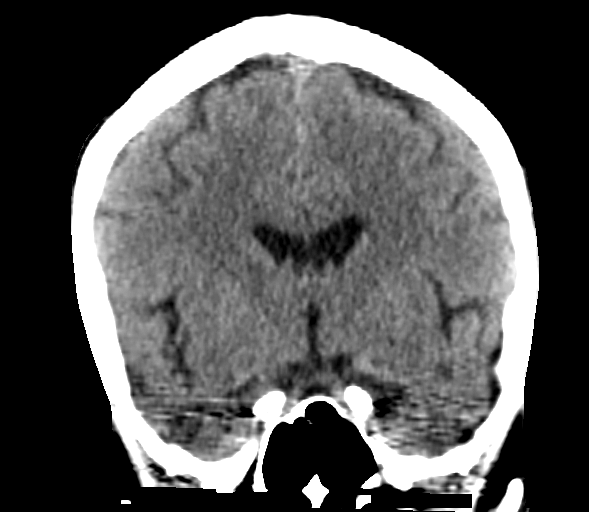

[Series 5: sagittal soft tissue · sagittal · 0.31mm/px · 3 of 58 slices shown]
[im 20/58  brain]
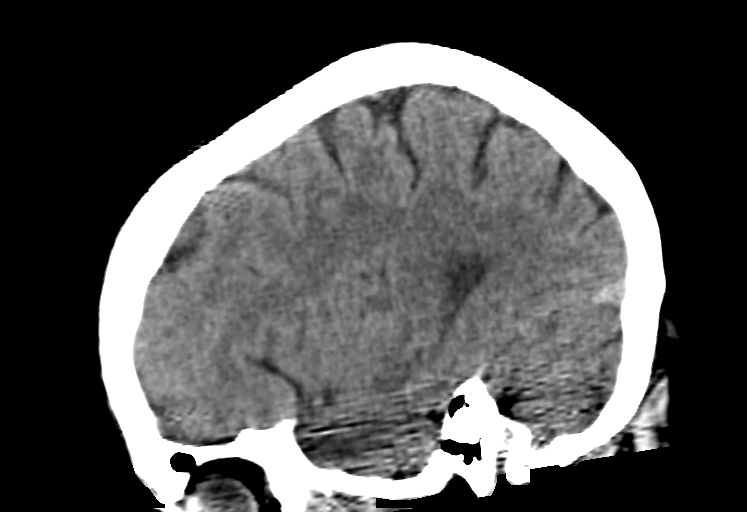
[im 29/58  brain]
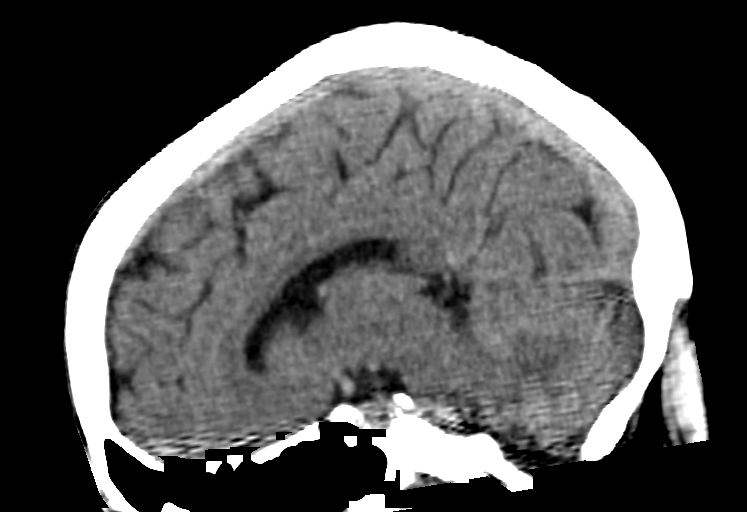
[im 39/58  brain]
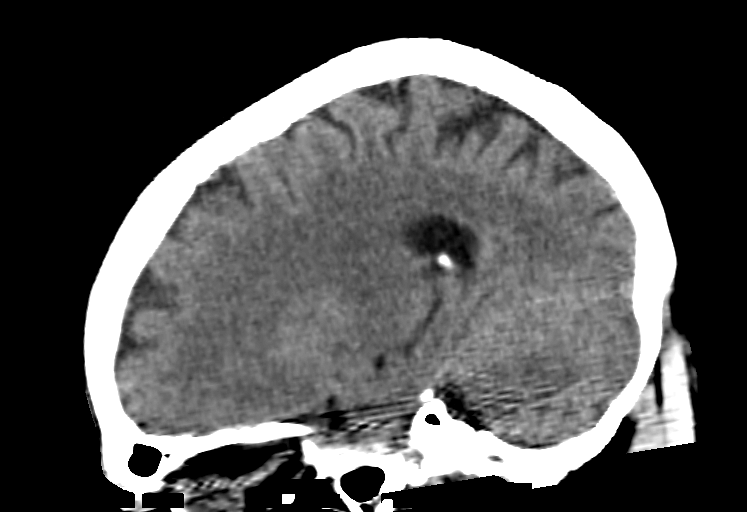

[15 of 47 positions shown; findings below may reference images not displayed]

FINDINGS: The ventricles are normal in size and configuration.

There are no parenchymal masses or mass effect. There is no evidence
of an infarct.

There are no extra-axial masses or abnormal fluid collections.

There is no intracranial hemorrhage.

The visualized sinuses and mastoid air cells are clear.
IMPRESSION: Normal unenhanced CT scan the brain
